# Patient Record
Sex: Male | Born: 1975 | Race: White | Hispanic: No | Marital: Single | Smoking: Current every day smoker
Health system: Southern US, Community
[De-identification: ages and names within clinical notes are randomized; demographics above are authoritative.]

## PROBLEM LIST (undated history)

## (undated) DIAGNOSIS — I10 Essential (primary) hypertension: Secondary | ICD-10-CM

---

## 2005-07-06 ENCOUNTER — Other Ambulatory Visit: Payer: Self-pay

## 2005-07-06 ENCOUNTER — Emergency Department: Payer: Self-pay | Admitting: Emergency Medicine

## 2016-11-20 ENCOUNTER — Emergency Department
Admission: EM | Admit: 2016-11-20 | Discharge: 2016-11-20 | Disposition: A | Discharge status: Home / Self Care | Payer: Medicare Other | Attending: Emergency Medicine | Admitting: Emergency Medicine

## 2016-11-20 ENCOUNTER — Encounter: Payer: Self-pay | Admitting: Emergency Medicine

## 2016-11-20 ENCOUNTER — Emergency Department: Payer: Medicare Other

## 2016-11-20 DIAGNOSIS — I10 Essential (primary) hypertension: Secondary | ICD-10-CM | POA: Insufficient documentation

## 2016-11-20 DIAGNOSIS — Y9389 Activity, other specified: Secondary | ICD-10-CM | POA: Diagnosis not present

## 2016-11-20 DIAGNOSIS — Y929 Unspecified place or not applicable: Secondary | ICD-10-CM | POA: Diagnosis not present

## 2016-11-20 DIAGNOSIS — W2209XA Striking against other stationary object, initial encounter: Secondary | ICD-10-CM | POA: Insufficient documentation

## 2016-11-20 DIAGNOSIS — F17210 Nicotine dependence, cigarettes, uncomplicated: Secondary | ICD-10-CM | POA: Insufficient documentation

## 2016-11-20 DIAGNOSIS — Y999 Unspecified external cause status: Secondary | ICD-10-CM | POA: Insufficient documentation

## 2016-11-20 DIAGNOSIS — S62144A Nondisplaced fracture of body of hamate [unciform] bone, right wrist, initial encounter for closed fracture: Secondary | ICD-10-CM

## 2016-11-20 DIAGNOSIS — S6991XA Unspecified injury of right wrist, hand and finger(s), initial encounter: Secondary | ICD-10-CM | POA: Diagnosis present

## 2016-11-20 DIAGNOSIS — S60221A Contusion of right hand, initial encounter: Secondary | ICD-10-CM

## 2016-11-20 HISTORY — DX: Essential (primary) hypertension: I10

## 2016-11-20 MED ORDER — IBUPROFEN 600 MG PO TABS: 600.0000 mg | ORAL_TABLET | Freq: Four times a day (QID) | ORAL | 0 refills | Status: DC | PRN

## 2016-11-20 NOTE — ED Provider Notes (Signed)
[facility] Emergency Department Provider Note ____________________________________________  Time seen: Approximately 9:06 PM  I have reviewed the triage vital signs and the nursing notes.   HISTORY  Chief Complaint Hand Pain    HPI Daryl Edwards is a 41 y.o. male who presents to the emergency department for evaluation of right hand pain. He was angry and punched a table earlier today with his right hand. He is right hand dominant. He now has pain in the hand and wrist. No previous fractures. He has not taken anything for pain prior to arrival.  Past Medical History:  Diagnosis Date  . HTN (hypertension)     There are no active problems to display for this patient.   History reviewed. No pertinent surgical history.  Prior to Admission medications   Medication Sig Start Date End Date Taking? Authorizing Provider  ibuprofen (ADVIL,MOTRIN) 600 MG tablet Take 1 tablet (600 mg total) by mouth every 6 (six) hours as needed. 11/20/16   Chinita Pesterari B Ingris Pasquarella, FNP    Allergies Patient has no known allergies.  No family history on file.  Social History Social History  Substance Use Topics  . Smoking status: Current Every Day Smoker    Packs/day: 0.50    Types: Cigarettes  . Smokeless tobacco: Never Used  . Alcohol use No    Review of Systems Constitutional: No recent illness. Cardiovascular: Denies chest pain or palpitations. Respiratory: Denies shortness of breath. Musculoskeletal: Pain in right hand and wrist. Skin: Negative for rash, wound, lesion. Neurological: Negative for focal weakness or numbness.  ____________________________________________   PHYSICAL EXAM:  VITAL SIGNS: ED Triage Vitals  Enc Vitals Group     BP 141/93     Pulse 101     Resp 18     Temp 98.3     Temp src      SpO2 99     Weight      Height      Head Circumference      Peak Flow      Pain Score      Pain Loc      Pain Edu?      Excl. in GC?      Constitutional: Alert and oriented. Well appearing and in no acute distress. Eyes: Conjunctivae are normal. EOMI. Head: Atraumatic. Neck: No stridor.  Respiratory: Normal respiratory effort.   Musculoskeletal: Bony tenderness over the 4th and 5th metacarpals of the right hand. No focal tenderness of the right wrist elicited on exam, but pain increases with slight flexion or extension. No deformity noted. Neurologic:  Normal speech and language. No gross focal neurologic deficits are appreciated. Speech is normal. No gait instability. Skin:  Skin is warm, dry and intact. Atraumatic. Psychiatric: Mood and affect are normal. Speech and behavior are normal.  ____________________________________________   LABS (all labs ordered are listed, but only abnormal results are displayed)  Labs Reviewed - No data to display ____________________________________________  RADIOLOGY  No acute bony abnormality of the right hand or wrist per radiologist.  On my reading, there is concern for a non-displaced fracture of the body of the hamate.  I, Kem Boroughsari Danetta Prom, personally viewed and evaluated these images (plain radiographs) as part of my medical decision making, as well as reviewing the written report by the radiologist. ___________________________________________   PROCEDURES  Procedure(s) performed: Wrist splint applied by RN. Patient was neurovascularly intact post-splint application.   ____________________________________________   INITIAL IMPRESSION / ASSESSMENT AND PLAN / ED COURSE  Pertinent labs & imaging results that were available during my care of the patient were reviewed by me and considered in my medical decision making (see chart for details).  Due to the possibility of nondisplaced fracture of the body of the hamate on the right and location of patient's pain, he is to follow-up with orthopedics in a couple weeks for a repeat x-ray. He was instructed to take ibuprofen  every 6 hours if needed for pain. He was advised to return to the emergency department for any symptom that changes or worsens if he is unable to schedule an appointment with his primary care provider or the specialist. ____________________________________________   FINAL CLINICAL IMPRESSION(S) / ED DIAGNOSES  Final diagnoses:  Closed nondisplaced fracture of body of hamate of right wrist, initial encounter  Contusion of right hand, initial encounter       Chinita Pester, FNP 11/20/16 2229    Jene Every, MD 11/20/16 2256

## 2016-11-20 NOTE — ED Triage Notes (Signed)
Pt states got mad and punched a table with his right hand. Some swelling noted at this time. No obvious deformity noted.

## 2016-11-20 NOTE — Discharge Instructions (Signed)
Call to schedule an appointment with orthopedics. Return to the ER for symptoms that change or worsen if you are unable to schedule an appointment.

## 2016-11-20 NOTE — ED Notes (Signed)
See this RN's triage note. Pt is alert and oriented, NAD noted, respirations even and unlabored, skin warm, dry, and intact.

## 2016-12-28 ENCOUNTER — Encounter: Payer: Self-pay | Admitting: *Deleted

## 2016-12-28 ENCOUNTER — Emergency Department
Admission: EM | Admit: 2016-12-28 | Discharge: 2016-12-28 | Disposition: A | Discharge status: Home / Self Care | Payer: Medicare Other | Attending: Emergency Medicine | Admitting: Emergency Medicine

## 2016-12-28 DIAGNOSIS — J039 Acute tonsillitis, unspecified: Secondary | ICD-10-CM | POA: Insufficient documentation

## 2016-12-28 DIAGNOSIS — F17210 Nicotine dependence, cigarettes, uncomplicated: Secondary | ICD-10-CM | POA: Insufficient documentation

## 2016-12-28 DIAGNOSIS — I10 Essential (primary) hypertension: Secondary | ICD-10-CM | POA: Diagnosis not present

## 2016-12-28 DIAGNOSIS — J029 Acute pharyngitis, unspecified: Secondary | ICD-10-CM | POA: Diagnosis present

## 2016-12-28 LAB — POCT RAPID STREP A: Streptococcus, Group A Screen (Direct): NEGATIVE

## 2016-12-28 MED ORDER — AMOXICILLIN 500 MG PO CAPS: 500.0000 mg | ORAL_CAPSULE | Freq: Three times a day (TID) | ORAL | 0 refills | Status: AC

## 2016-12-28 MED ORDER — LIDOCAINE VISCOUS 2 % MT SOLN: 5.0000 mL | Freq: Four times a day (QID) | OROMUCOSAL | 0 refills | Status: DC | PRN

## 2016-12-28 MED ORDER — FIRST-DUKES MOUTHWASH MT SUSP: 10.0000 mL | Freq: Four times a day (QID) | OROMUCOSAL | 0 refills | Status: DC

## 2016-12-28 NOTE — ED Provider Notes (Signed)
[facility] Emergency Department Provider Note   ____________________________________________   First MD Initiated Contact with Patient 12/28/16 1152     (approximate)  I have reviewed the triage vital signs and the nursing notes.   HISTORY  Chief Complaint Sore Throat    HPI Daryl Edwards is a 41 y.o. male patient complaining of sore throat and fatigue for 3 days. He denies URI signs and symptoms. Patient stated pain increases swallowing. Patient rates pain as a 6/10. Patient states able to tolerate food and fluids without discomfort. No palliative measures for his complaint.   Past Medical History:  Diagnosis Date  . HTN (hypertension)     There are no active problems to display for this patient.   History reviewed. No pertinent surgical history.  Prior to Admission medications   Medication Sig Start Date End Date Taking? Authorizing Provider  amoxicillin (AMOXIL) 500 MG capsule Take 1 capsule (500 mg total) by mouth 3 (three) times daily. 12/28/16   Joni Reining, PA-C  Diphenhyd-Hydrocort-Nystatin (FIRST-DUKES MOUTHWASH) SUSP Use as directed 10 mLs in the mouth or throat 4 (four) times daily. Viscous mouthwash swish and swallow 12/28/16   Joni Reining, PA-C  ibuprofen (ADVIL,MOTRIN) 600 MG tablet Take 1 tablet (600 mg total) by mouth every 6 (six) hours as needed. 11/20/16   Chinita Pester, FNP  lidocaine (XYLOCAINE) 2 % solution Use as directed 5 mLs in the mouth or throat every 6 (six) hours as needed for mouth pain. mix with [facility] mouthwash swish and swallow. 12/28/16   Joni Reining, PA-C    Allergies Patient has no known allergies.  History reviewed. No pertinent family history.  Social History Social History  Substance Use Topics  . Smoking status: Current Every Day Smoker    Packs/day: 0.50    Types: Cigarettes  . Smokeless tobacco: Never Used  . Alcohol use No    Review of Systems Constitutional: No  fever/chills Eyes: No visual changes. ENT: Sore throat  Cardiovascular: Denies chest pain. Respiratory: Denies shortness of breath. Gastrointestinal: No abdominal pain.  No nausea, no vomiting.  No diarrhea.  No constipation. Genitourinary: Negative for dysuria. Musculoskeletal: Negative for back pain. Skin: Negative for rash. Neurological: Negative for headaches, focal weakness or numbness.    ____________________________________________   PHYSICAL EXAM:  VITAL SIGNS: ED Triage Vitals [12/28/16 1039]  Enc Vitals Group     BP 117/85     Pulse Rate (!) 118     Resp 18     Temp 99.9 F (37.7 C)     Temp Source Oral     SpO2 98 %     Weight 240 lb (108.9 kg)     Height  (1.854 m)     Head Circumference      Peak Flow      Pain Score 5     Pain Loc      Pain Edu?      Excl. in GC?     Constitutional: Alert and oriented. Well appearing and in no acute distress. Eyes: Conjunctivae are normal. PERRL. EOMI. Head: Atraumatic. Nose: No congestion/rhinnorhea. Mouth/Throat: Mucous membranes are moist.  Oropharynx non-erythematous.Right exudative tonsils Neck: No stridor.  No cervical spine tenderness to palpation. Hematological/Lymphatic/Immunilogical: Bilateral cervical lymphadenopathy. Cardiovascular: Normal rate, regular rhythm. Grossly normal heart sounds.  Good peripheral circulation. Respiratory: Normal respiratory effort.  No retractions. Lungs CTAB. Gastrointestinal: Soft and nontender. No distention. No abdominal bruits. No CVA tenderness. Musculoskeletal: No  lower extremity tenderness nor edema.  No joint effusions. Neurologic:  Normal speech and language. No gross focal neurologic deficits are appreciated. No gait instability. Skin:  Skin is warm, dry and intact. No rash noted. Psychiatric: Mood and affect are normal. Speech and behavior are normal.  ____________________________________________   LABS (all labs ordered are listed, but only abnormal results  are displayed)  Labs Reviewed  CULTURE, GROUP A STREP [facility])  POCT RAPID STREP A   ____________________________________________  EKG   ____________________________________________  RADIOLOGY   ____________________________________________   PROCEDURES  Procedure(s) performed: None  Procedures  Critical Care performed: No  ____________________________________________   INITIAL IMPRESSION / ASSESSMENT AND PLAN / ED COURSE  Pertinent labs & imaging results that were available during my care of the patient were reviewed by me and considered in my medical decision making (see chart for details).  Exudative tonsils.      ____________________________________________   FINAL CLINICAL IMPRESSION(S) / ED DIAGNOSES  Final diagnoses:  Tonsillitis  Patient given discharge instructions. Patient advised throat culture is pending. Patient given a prescription for amoxicillin, mouthwash to mix of viscous lidocaine swish and swallow. Patient advised follow-up family doctor condition persists.    NEW MEDICATIONS STARTED DURING THIS VISIT:  New Prescriptions   AMOXICILLIN (AMOXIL) 500 MG CAPSULE    Take 1 capsule (500 mg total) by mouth 3 (three) times daily.   DIPHENHYD-HYDROCORT-NYSTATIN (FIRST-DUKES MOUTHWASH) SUSP    Use as directed 10 mLs in the mouth or throat 4 (four) times daily. Viscous mouthwash swish and swallow   LIDOCAINE (XYLOCAINE) 2 % SOLUTION    Use as directed 5 mLs in the mouth or throat every 6 (six) hours as needed for mouth pain. mix with [facility] mouthwash swish and swallow.     Note:  This document was prepared using Dragon voice recognition software and may include unintentional dictation errors.    Joni Reining, PA-C 12/28/16 1204    Emily Filbert, MD 12/28/16 [phone]

## 2016-12-28 NOTE — ED Notes (Signed)
AAOx3.  Skin warm and dry.  Voice clear and strong.  NAD.  

## 2016-12-28 NOTE — ED Triage Notes (Addendum)
States sore throat for 3 days, speaking in full clear sentances, white noticed on right tonsil upon assesment

## 2016-12-29 LAB — CULTURE, GROUP A STREP (THRC)

## 2021-03-15 ENCOUNTER — Emergency Department (HOSPITAL_COMMUNITY): Payer: Medicare Other

## 2021-03-15 ENCOUNTER — Emergency Department (HOSPITAL_COMMUNITY)
Admission: EM | Admit: 2021-03-15 | Discharge: 2021-03-16 | Disposition: A | Discharge status: Other Acute Inpatient Hospital | Payer: Medicare Other | Attending: Emergency Medicine | Admitting: Emergency Medicine

## 2021-03-15 DIAGNOSIS — S52501C Unspecified fracture of the lower end of right radius, initial encounter for open fracture type IIIA, IIIB, or IIIC: Secondary | ICD-10-CM | POA: Insufficient documentation

## 2021-03-15 DIAGNOSIS — S61501A Unspecified open wound of right wrist, initial encounter: Secondary | ICD-10-CM

## 2021-03-15 DIAGNOSIS — Y92410 Unspecified street and highway as the place of occurrence of the external cause: Secondary | ICD-10-CM | POA: Insufficient documentation

## 2021-03-15 DIAGNOSIS — I10 Essential (primary) hypertension: Secondary | ICD-10-CM | POA: Diagnosis not present

## 2021-03-15 DIAGNOSIS — F17210 Nicotine dependence, cigarettes, uncomplicated: Secondary | ICD-10-CM | POA: Insufficient documentation

## 2021-03-15 DIAGNOSIS — Z20822 Contact with and (suspected) exposure to covid-19: Secondary | ICD-10-CM | POA: Diagnosis not present

## 2021-03-15 DIAGNOSIS — S8992XA Unspecified injury of left lower leg, initial encounter: Secondary | ICD-10-CM | POA: Diagnosis not present

## 2021-03-15 DIAGNOSIS — S63014A Dislocation of distal radioulnar joint of right wrist, initial encounter: Secondary | ICD-10-CM | POA: Diagnosis not present

## 2021-03-15 DIAGNOSIS — R52 Pain, unspecified: Secondary | ICD-10-CM

## 2021-03-15 DIAGNOSIS — Z23 Encounter for immunization: Secondary | ICD-10-CM | POA: Insufficient documentation

## 2021-03-15 DIAGNOSIS — S6991XA Unspecified injury of right wrist, hand and finger(s), initial encounter: Secondary | ICD-10-CM | POA: Diagnosis present

## 2021-03-15 DIAGNOSIS — S52611C Displaced fracture of right ulna styloid process, initial encounter for open fracture type IIIA, IIIB, or IIIC: Secondary | ICD-10-CM | POA: Insufficient documentation

## 2021-03-15 LAB — CBC WITH DIFFERENTIAL/PLATELET
Abs Immature Granulocytes: 0.28 10*3/uL — ABNORMAL HIGH (ref 0.00–0.07)
Basophils Absolute: 0.1 10*3/uL (ref 0.0–0.1)
Basophils Relative: 0 %
Eosinophils Absolute: 0.1 10*3/uL (ref 0.0–0.5)
Eosinophils Relative: 0 %
HCT: 42.5 % (ref 39.0–52.0)
Hemoglobin: 14.5 g/dL (ref 13.0–17.0)
Immature Granulocytes: 1 %
Lymphocytes Relative: 5 %
Lymphs Abs: 1.6 10*3/uL (ref 0.7–4.0)
MCH: 30 pg (ref 26.0–34.0)
MCHC: 34.1 g/dL (ref 30.0–36.0)
MCV: 87.8 fL (ref 80.0–100.0)
Monocytes Absolute: 1.3 10*3/uL — ABNORMAL HIGH (ref 0.1–1.0)
Monocytes Relative: 4 %
Neutro Abs: 26.3 10*3/uL — ABNORMAL HIGH (ref 1.7–7.7)
Neutrophils Relative %: 90 %
Platelets: 214 10*3/uL (ref 150–400)
RBC: 4.84 MIL/uL (ref 4.22–5.81)
RDW: 13.1 % (ref 11.5–15.5)
Smear Review: ADEQUATE
WBC: 29.6 10*3/uL — ABNORMAL HIGH (ref 4.0–10.5)
nRBC: 0 % (ref 0.0–0.2)

## 2021-03-15 LAB — BASIC METABOLIC PANEL
Anion gap: 9 (ref 5–15)
BUN: 10 mg/dL (ref 6–20)
CO2: 24 mmol/L (ref 22–32)
Calcium: 9.5 mg/dL (ref 8.9–10.3)
Chloride: 106 mmol/L (ref 98–111)
Creatinine, Ser: 1.09 mg/dL (ref 0.61–1.24)
GFR, Estimated: >60 mL/min (ref >60)
Glucose, Bld: 153 mg/dL — ABNORMAL HIGH (ref 70–99)
Potassium: 3.4 mmol/L — ABNORMAL LOW (ref 3.5–5.1)
Sodium: 139 mmol/L (ref 135–145)

## 2021-03-15 LAB — RESP PANEL BY RT-PCR (FLU A&B, COVID) ARPGX2
Influenza A by PCR: NEGATIVE
Influenza B by PCR: NEGATIVE
SARS Coronavirus 2 by RT PCR: NEGATIVE

## 2021-03-15 MED ORDER — HYDROMORPHONE HCL 1 MG/ML IJ SOLN
1.0000 mg | Freq: Once | INTRAMUSCULAR | Status: AC
  Administered 2021-03-15: 19:00:00 1 mg via INTRAVENOUS
  Filled 2021-03-15: qty 1

## 2021-03-15 MED ORDER — KETAMINE HCL 50 MG/5ML IJ SOSY
0.5000 mg/kg | PREFILLED_SYRINGE | Freq: Once | INTRAMUSCULAR | Status: AC
  Administered 2021-03-15: 23:00:00 54 mg via INTRAVENOUS
  Filled 2021-03-15: qty 10

## 2021-03-15 MED ORDER — SODIUM CHLORIDE 0.9 % IV BOLUS
1000.0000 mL | Freq: Once | INTRAVENOUS | Status: AC
  Administered 2021-03-15: 21:00:00 1000 mL via INTRAVENOUS

## 2021-03-15 MED ORDER — ETOMIDATE 2 MG/ML IV SOLN
0.1500 mg/kg | Freq: Once | INTRAVENOUS | Status: DC
  Filled 2021-03-15: qty 10

## 2021-03-15 MED ORDER — HYDROMORPHONE HCL 1 MG/ML IJ SOLN
1.0000 mg | Freq: Once | INTRAMUSCULAR | Status: AC
  Administered 2021-03-15: 23:00:00 1 mg via INTRAVENOUS
  Filled 2021-03-15: qty 1

## 2021-03-15 MED ORDER — TETANUS-DIPHTH-ACELL PERTUSSIS 5-2.5-18.5 LF-MCG/0.5 IM SUSY
0.5000 mL | PREFILLED_SYRINGE | Freq: Once | INTRAMUSCULAR | Status: AC
  Administered 2021-03-15: 21:00:00 0.5 mL via INTRAMUSCULAR
  Filled 2021-03-15: qty 0.5

## 2021-03-15 MED ORDER — HYDROMORPHONE HCL 1 MG/ML IJ SOLN
1.0000 mg | Freq: Once | INTRAMUSCULAR | Status: AC
  Administered 2021-03-15: 21:00:00 1 mg via INTRAVENOUS
  Filled 2021-03-15: qty 1

## 2021-03-15 MED ORDER — PROPOFOL 10 MG/ML IV BOLUS
0.5000 mg/kg | Freq: Once | INTRAVENOUS | Status: AC
Start: 2021-03-15 — End: 2021-03-15
  Administered 2021-03-15: 23:00:00 54.5 mg via INTRAVENOUS
  Filled 2021-03-15: qty 20

## 2021-03-15 MED ORDER — CEFAZOLIN SODIUM-DEXTROSE 2-4 GM/100ML-% IV SOLN
2.0000 g | Freq: Once | INTRAVENOUS | Status: AC
  Administered 2021-03-15: 21:00:00 2 g via INTRAVENOUS
  Filled 2021-03-15: qty 100

## 2021-03-15 NOTE — Sedation Documentation (Signed)
2.5 ml propofol and katamine

## 2021-03-15 NOTE — Progress Notes (Signed)
Orthopedic Tech Progress Note Patient Details:  Daryl Edwards March 19, 1976 [id number]  Ortho Devices Type of Ortho Device: Finger trap, Sugartong splint, Arm sling Finger Trap Weight: gravity. Ortho Device/Splint Location: rue Ortho Device/Splint Interventions: Ordered, Application, Adjustment  I assisted the Dr with the reduction of the rue. After reduction we applied a dressing to the wound. Then I wrapped the lower part of the arm with a kerlix to even out the size of the arm so that the splint would apply evenly. Then I applied a sugartong splint with the Drs assistance. Then I applied an arm sling. Post Interventions Patient Tolerated: Well Instructions Provided: Care of device, Adjustment of device  Trinna Post 03/15/2021, 11:14 PM

## 2021-03-15 NOTE — ED Notes (Addendum)
Called [facility] to complete transfer. Admitting doctor is Dr. Jackelyn Knife. Called [facility] to transport patient. State that transport will be within the hour. Number to call report [phone].

## 2021-03-15 NOTE — Sedation Documentation (Signed)
2.86ml ketamine and propofol

## 2021-03-15 NOTE — Sedation Documentation (Signed)
73ml propofol

## 2021-03-15 NOTE — Sedation Documentation (Addendum)
37ml ketamine and propofol
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
## 2021-03-16 DIAGNOSIS — Z20822 Contact with and (suspected) exposure to covid-19: Secondary | ICD-10-CM | POA: Diagnosis not present

## 2021-03-16 DIAGNOSIS — I10 Essential (primary) hypertension: Secondary | ICD-10-CM | POA: Diagnosis not present

## 2021-03-16 DIAGNOSIS — Y92410 Unspecified street and highway as the place of occurrence of the external cause: Secondary | ICD-10-CM | POA: Diagnosis not present

## 2021-03-16 DIAGNOSIS — S8992XA Unspecified injury of left lower leg, initial encounter: Secondary | ICD-10-CM | POA: Diagnosis not present

## 2021-03-16 DIAGNOSIS — S52611C Displaced fracture of right ulna styloid process, initial encounter for open fracture type IIIA, IIIB, or IIIC: Secondary | ICD-10-CM | POA: Diagnosis not present

## 2021-03-16 DIAGNOSIS — S52501C Unspecified fracture of the lower end of right radius, initial encounter for open fracture type IIIA, IIIB, or IIIC: Secondary | ICD-10-CM | POA: Diagnosis not present

## 2021-03-16 DIAGNOSIS — Z23 Encounter for immunization: Secondary | ICD-10-CM | POA: Diagnosis not present

## 2021-03-16 DIAGNOSIS — S6991XA Unspecified injury of right wrist, hand and finger(s), initial encounter: Secondary | ICD-10-CM | POA: Diagnosis present

## 2021-03-16 DIAGNOSIS — S63014A Dislocation of distal radioulnar joint of right wrist, initial encounter: Secondary | ICD-10-CM | POA: Diagnosis not present

## 2021-03-16 DIAGNOSIS — F17210 Nicotine dependence, cigarettes, uncomplicated: Secondary | ICD-10-CM | POA: Diagnosis not present

## 2021-03-16 MED ORDER — LORAZEPAM 2 MG/ML IJ SOLN
1.0000 mg | Freq: Once | INTRAMUSCULAR | Status: AC
  Administered 2021-03-16: 1 mg via INTRAVENOUS
  Filled 2021-03-16: qty 1

## 2021-03-16 NOTE — ED Notes (Signed)
This RN wasted in sharps 1mg  of lorazepam witnessed by , Carollee Herter.

## 2021-03-18 MED FILL — Fentanyl Citrate Preservative Free (PF) Inj 100 MCG/2ML: INTRAMUSCULAR | Qty: 2 | Status: AC
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
